# Patient Record
Sex: Male | Born: 1985 | Race: Black or African American | Hispanic: No | Marital: Single
Health system: Southern US, Community
[De-identification: ages and names within clinical notes are randomized; demographics above are authoritative.]

---

## 2011-09-05 ENCOUNTER — Emergency Department (HOSPITAL_COMMUNITY): Payer: No Typology Code available for payment source

## 2011-09-05 ENCOUNTER — Encounter (HOSPITAL_COMMUNITY): Payer: Self-pay

## 2011-09-05 ENCOUNTER — Emergency Department (HOSPITAL_COMMUNITY)
Admission: EM | Admit: 2011-09-05 | Discharge: 2011-09-05 | Disposition: A | Discharge status: Home / Self Care | Payer: No Typology Code available for payment source | Attending: Emergency Medicine | Admitting: Emergency Medicine

## 2011-09-05 DIAGNOSIS — Y92410 Unspecified street and highway as the place of occurrence of the external cause: Secondary | ICD-10-CM | POA: Insufficient documentation

## 2011-09-05 DIAGNOSIS — M549 Dorsalgia, unspecified: Secondary | ICD-10-CM

## 2011-09-05 DIAGNOSIS — M7918 Myalgia, other site: Secondary | ICD-10-CM

## 2011-09-05 DIAGNOSIS — M25561 Pain in right knee: Secondary | ICD-10-CM

## 2011-09-05 DIAGNOSIS — M25569 Pain in unspecified knee: Secondary | ICD-10-CM | POA: Insufficient documentation

## 2011-09-05 MED ORDER — CYCLOBENZAPRINE HCL 10 MG PO TABS
10.0000 mg | ORAL_TABLET | Freq: Once | ORAL | Status: AC
  Administered 2011-09-05: 10 mg via ORAL
  Filled 2011-09-05: qty 1

## 2011-09-05 MED ORDER — CYCLOBENZAPRINE HCL 10 MG PO TABS: 10.0000 mg | ORAL_TABLET | Freq: Three times a day (TID) | ORAL | Status: AC | PRN

## 2011-09-05 MED ORDER — IBUPROFEN 400 MG PO TABS
800.0000 mg | ORAL_TABLET | Freq: Once | ORAL | Status: AC
  Administered 2011-09-05: 800 mg via ORAL
  Filled 2011-09-05: qty 2
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
## 2016-05-04 ENCOUNTER — Emergency Department (HOSPITAL_COMMUNITY)
Admission: EM | Admit: 2016-05-04 | Discharge: 2016-05-04 | Disposition: A | Discharge status: Home / Self Care | Payer: BLUE CROSS/BLUE SHIELD | Attending: Emergency Medicine | Admitting: Emergency Medicine

## 2016-05-04 ENCOUNTER — Encounter (HOSPITAL_COMMUNITY): Payer: Self-pay | Admitting: Emergency Medicine

## 2016-05-04 DIAGNOSIS — R55 Syncope and collapse: Secondary | ICD-10-CM | POA: Insufficient documentation

## 2016-05-04 LAB — BASIC METABOLIC PANEL
Anion gap: 6 (ref 5–15)
BUN: 12 mg/dL (ref 6–20)
CHLORIDE: 108 mmol/L (ref 101–111)
CO2: 30 mmol/L (ref 22–32)
CREATININE: 1 mg/dL (ref 0.61–1.24)
Calcium: 9.5 mg/dL (ref 8.9–10.3)
GFR calc Af Amer: >60 mL/min (ref >60)
GFR calc non Af Amer: >60 mL/min (ref >60)
GLUCOSE: 90 mg/dL (ref 65–99)
POTASSIUM: 3.8 mmol/L (ref 3.5–5.1)
Sodium: 144 mmol/L (ref 135–145)

## 2016-05-04 LAB — URINALYSIS, ROUTINE W REFLEX MICROSCOPIC
Bilirubin Urine: NEGATIVE
Glucose, UA: NEGATIVE mg/dL
HGB URINE DIPSTICK: NEGATIVE
Ketones, ur: NEGATIVE mg/dL
LEUKOCYTES UA: NEGATIVE
Nitrite: NEGATIVE
Protein, ur: NEGATIVE mg/dL
Specific Gravity, Urine: 1.016 (ref 1.005–1.030)
pH: 6 (ref 5.0–8.0)

## 2016-05-04 LAB — CBC
HCT: 42.7 % (ref 39.0–52.0)
Hemoglobin: 14.4 g/dL (ref 13.0–17.0)
MCH: 29.7 pg (ref 26.0–34.0)
MCHC: 33.7 g/dL (ref 30.0–36.0)
MCV: 88 fL (ref 78.0–100.0)
Platelets: 217 10*3/uL (ref 150–400)
RBC: 4.85 MIL/uL (ref 4.22–5.81)
RDW: 13.8 % (ref 11.5–15.5)
WBC: 5.6 10*3/uL (ref 4.0–10.5)

## 2016-05-04 LAB — I-STAT TROPONIN, ED: Troponin i, poc: 0 ng/mL (ref 0.00–0.08)

## 2016-05-04 LAB — CBG MONITORING, ED: GLUCOSE-CAPILLARY: 65 mg/dL (ref 65–99)

## 2016-05-04 MED ORDER — SODIUM CHLORIDE 0.9 % IV BOLUS (SEPSIS)
1000.0000 mL | Freq: Once | INTRAVENOUS | Status: AC
  Administered 2016-05-04: 1000 mL via INTRAVENOUS

## 2016-05-04 MED ORDER — ONDANSETRON HCL 4 MG/2ML IJ SOLN
4.0000 mg | Freq: Once | INTRAMUSCULAR | Status: DC
  Filled 2016-05-04: qty 2

## 2016-05-04 NOTE — ED Provider Notes (Signed)
[facility]-EMERGENCY DEPT Provider Note   CSN: [id number] Arrival date & time: 05/04/16  1714     History   Chief Complaint Chief Complaint  Patient presents with  . Loss of Consciousness    HPI Gabriel Maldonado is a 31 y.o. male.  The history is provided by the patient and medical records.  Loss of Consciousness      31 year old male with no significant past medical history presenting to the ED after a near syncopal episode. 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 States he was working and all of a sudden got very lightheaded and felt as if he may pass out. States he started to stagger but a coworker called him and lowered him to the floor. Never struck his head or lost consciousness.  They tried to give him some medication (he is unsure what this was) and water but he vomited afterwards. He states he continues to feel somewhat weak and lightheaded. He denies any chest pain or shortness of breath. No history of syncope in the past. No cardiac history. He does smoke cigarettes and occasionally marijuana. Reports drinking alcohol last night, none today. States he does not feel confused or disoriented, his only other complaint is that he feels cold.  History reviewed. No pertinent past medical history.  There are no active problems to display for this patient.   History reviewed. No pertinent surgical history.     Home Medications    Prior to Admission medications   Not on File    Family History No family history on file.  Social History Social History  Substance Use Topics  . Smoking status: Not on file  . Smokeless tobacco: Not on file  . Alcohol use No     Allergies   Patient has no known allergies.   Review of Systems Review of Systems  Cardiovascular: Positive for syncope.  Neurological: Positive for syncope.  All other systems reviewed and are negative.    Physical Exam Updated Vital Signs BP (!) 144/86 (BP Location: Left  Arm)   Pulse (!) 47   Temp 98 F (36.7 C) (Oral)   Resp 20   SpO2 100%   Physical Exam  Constitutional: He is oriented to person, place, and time. He appears well-developed and well-nourished.  HENT:  Head: Normocephalic and atraumatic.  Mouth/Throat: Oropharynx is clear and moist.  Eyes: Conjunctivae and EOM are normal. Pupils are equal, round, and reactive to light.  Neck: Normal range of motion.  Cardiovascular: Normal rate, regular rhythm and normal heart sounds.   Pulmonary/Chest: Effort normal and breath sounds normal.  Abdominal: Soft. Bowel sounds are normal.  Musculoskeletal: Normal range of motion.  Neurological: He is alert and oriented to person, place, and time.  AAOx3, answering questions and following commands appropriately; equal strength UE and LE bilaterally; CN grossly intact; moves all extremities appropriately without ataxia; no focal neuro deficits or facial asymmetry appreciated  Skin: Skin is warm and dry.  Psychiatric: He has a normal mood and affect.  Nursing note and vitals reviewed.    ED Treatments / Results  Labs (all labs ordered are listed, but only abnormal results are displayed) Labs Reviewed  BASIC METABOLIC PANEL  CBC  URINALYSIS, ROUTINE W REFLEX MICROSCOPIC  CBG MONITORING, ED  I-STAT TROPOININ, ED    EKG  EKG Interpretation None       Radiology No results found.  Procedures Procedures (including critical care time)  Medications Ordered in ED  Medications  sodium chloride 0.9 % bolus 1,000 mL (not administered)     Initial Impression / Assessment and Plan / ED Course  I have reviewed the triage vital signs and the nursing notes.  Pertinent labs & imaging results that were available during my care of the patient were reviewed by me and considered in my medical decision making (see chart for details).  31 year old male here after near syncopal event while working. He reports he was in a hot warehouse.  He did not strike  his, or fully lose consciousness.  Here he is awake, alert, fully oriented to baseline. No apparent neurologic deficit. His vitals are stable. Screening lab work is reassuring. EKG nonischemic. Patient was treated with IV fluids and Zofran. States he is feeling better at this time. Remains neurologically intact. Feels he is stable for discharge. Syncope may be related to heat versus dehydration versus other. Low suspicion for acute neurologic or cardiac etiology of his syncope.    Feel he is stable for discharge.  Encouraged oral hydration.  Discussed plan with patient, he acknowledged understanding and agreed with plan of care.  Return precautions given for new or worsening symptoms.  Final Clinical Impressions(s) / ED Diagnoses   Final diagnoses:  Near syncope    New Prescriptions New Prescriptions   No medications on file     Garlon HatchetLisa M Sanders, Cordelia Poche-C 05/04/16 1947    Shaune Pollackameron Isaacs, MD 05/05/16 [phone]
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
## 2016-05-04 NOTE — ED Notes (Signed)
Patient made aware of urine sample. Urinal placed at bedside and patient encouraged to void when able. 

## 2016-05-04 NOTE — Discharge Instructions (Signed)
Your blood work and EKG today were normal.  You may have passed out from the heat, dehydration, etc. Make sure to stay well hydrated at work tomorrow. Return to the ED for new or worsening symptoms.

## 2020-10-30 ENCOUNTER — Ambulatory Visit: Payer: Self-pay | Admitting: Internal Medicine

## 2021-01-29 ENCOUNTER — Ambulatory Visit: Payer: Self-pay | Admitting: Internal Medicine
